# Patient Record
Sex: Female | Born: 1935 | Race: White | Hispanic: No | Marital: Single | Smoking: Never smoker
Health system: Southern US, Community
[De-identification: ages and names within clinical notes are randomized; demographics above are authoritative.]

## PROBLEM LIST (undated history)

## (undated) DIAGNOSIS — Z950 Presence of cardiac pacemaker: Secondary | ICD-10-CM

## (undated) DIAGNOSIS — I639 Cerebral infarction, unspecified: Secondary | ICD-10-CM

## (undated) DIAGNOSIS — I739 Peripheral vascular disease, unspecified: Secondary | ICD-10-CM

---

## 2009-02-09 ENCOUNTER — Inpatient Hospital Stay (HOSPITAL_COMMUNITY): Admission: EM | Admit: 2009-02-09 | Discharge: 2009-02-13 | Payer: Self-pay | Admitting: Neurosurgery

## 2009-02-12 ENCOUNTER — Ambulatory Visit: Payer: Self-pay | Admitting: Physical Medicine & Rehabilitation

## 2009-02-13 ENCOUNTER — Inpatient Hospital Stay (HOSPITAL_COMMUNITY)
Admission: RE | Admit: 2009-02-13 | Discharge: 2009-03-09 | Payer: Self-pay | Admitting: Physical Medicine & Rehabilitation

## 2009-02-14 ENCOUNTER — Ambulatory Visit: Payer: Self-pay | Admitting: Physical Medicine & Rehabilitation

## 2009-04-20 ENCOUNTER — Ambulatory Visit (HOSPITAL_COMMUNITY): Admission: RE | Admit: 2009-04-20 | Discharge: 2009-04-20 | Payer: Self-pay | Admitting: Neurosurgery

## 2009-04-23 ENCOUNTER — Encounter
Admission: RE | Admit: 2009-04-23 | Discharge: 2009-07-22 | Payer: Self-pay | Admitting: Physical Medicine & Rehabilitation

## 2009-04-24 ENCOUNTER — Ambulatory Visit: Payer: Self-pay | Admitting: Physical Medicine & Rehabilitation

## 2009-06-18 ENCOUNTER — Ambulatory Visit: Payer: Self-pay | Admitting: Physical Medicine & Rehabilitation

## 2009-10-10 ENCOUNTER — Encounter
Admission: RE | Admit: 2009-10-10 | Discharge: 2009-10-15 | Payer: Self-pay | Admitting: Physical Medicine & Rehabilitation

## 2009-10-15 ENCOUNTER — Ambulatory Visit: Payer: Self-pay | Admitting: Physical Medicine & Rehabilitation

## 2010-09-03 ENCOUNTER — Encounter
Admission: RE | Admit: 2010-09-03 | Discharge: 2010-09-03 | Payer: Self-pay | Source: Home / Self Care | Attending: Neurosurgery | Admitting: Neurosurgery

## 2010-09-16 ENCOUNTER — Encounter: Payer: Self-pay | Admitting: Neurosurgery

## 2010-12-01 LAB — BASIC METABOLIC PANEL
CO2: 29 mEq/L (ref 19–32)
Calcium: 9.4 mg/dL (ref 8.4–10.5)
Creatinine, Ser: 0.69 mg/dL (ref 0.4–1.2)
GFR calc non Af Amer: >60 mL/min (ref >60)
Glucose, Bld: 96 mg/dL (ref 70–99)

## 2010-12-01 LAB — CBC
MCHC: 33.9 g/dL (ref 30.0–36.0)
Platelets: 231 10*3/uL (ref 150–400)
RDW: 13.2 % (ref 11.5–15.5)

## 2010-12-01 LAB — DIGOXIN LEVEL: Digoxin Level: 0.4 ng/mL — ABNORMAL LOW (ref 0.8–2.0)

## 2010-12-02 LAB — BASIC METABOLIC PANEL
BUN: 5 mg/dL — ABNORMAL LOW (ref 6–23)
BUN: 7 mg/dL (ref 6–23)
BUN: 8 mg/dL (ref 6–23)
BUN: 9 mg/dL (ref 6–23)
CO2: 25 mEq/L (ref 19–32)
CO2: 26 mEq/L (ref 19–32)
CO2: 27 mEq/L (ref 19–32)
CO2: 28 mEq/L (ref 19–32)
Calcium: 8.3 mg/dL — ABNORMAL LOW (ref 8.4–10.5)
Calcium: 8.4 mg/dL (ref 8.4–10.5)
Calcium: 8.7 mg/dL (ref 8.4–10.5)
Calcium: 8.9 mg/dL (ref 8.4–10.5)
Chloride: 102 mEq/L (ref 96–112)
Chloride: 102 mEq/L (ref 96–112)
Chloride: 103 mEq/L (ref 96–112)
Chloride: 105 mEq/L (ref 96–112)
Creatinine, Ser: 0.49 mg/dL (ref 0.4–1.2)
Creatinine, Ser: 0.51 mg/dL (ref 0.4–1.2)
Creatinine, Ser: 0.53 mg/dL (ref 0.4–1.2)
Creatinine, Ser: 0.57 mg/dL (ref 0.4–1.2)
GFR calc Af Amer: >60 mL/min (ref >60)
GFR calc Af Amer: >60 mL/min (ref >60)
GFR calc Af Amer: >60 mL/min (ref >60)
GFR calc Af Amer: >60 mL/min (ref >60)
GFR calc non Af Amer: >60 mL/min (ref >60)
GFR calc non Af Amer: >60 mL/min (ref >60)
GFR calc non Af Amer: >60 mL/min (ref >60)
GFR calc non Af Amer: >60 mL/min (ref >60)
Glucose, Bld: 112 mg/dL — ABNORMAL HIGH (ref 70–99)
Glucose, Bld: 119 mg/dL — ABNORMAL HIGH (ref 70–99)
Glucose, Bld: 152 mg/dL — ABNORMAL HIGH (ref 70–99)
Glucose, Bld: 99 mg/dL (ref 70–99)
Potassium: 3.4 mEq/L — ABNORMAL LOW (ref 3.5–5.1)
Potassium: 3.5 mEq/L (ref 3.5–5.1)
Potassium: 3.8 mEq/L (ref 3.5–5.1)
Potassium: 4.1 mEq/L (ref 3.5–5.1)
Sodium: 135 mEq/L (ref 135–145)
Sodium: 136 mEq/L (ref 135–145)
Sodium: 138 mEq/L (ref 135–145)
Sodium: 139 mEq/L (ref 135–145)

## 2010-12-02 LAB — DIFFERENTIAL
Basophils Absolute: 0 10*3/uL (ref 0.0–0.1)
Basophils Absolute: 0 10*3/uL (ref 0.0–0.1)
Basophils Absolute: 0 10*3/uL (ref 0.0–0.1)
Basophils Relative: 0 % (ref 0–1)
Basophils Relative: 0 % (ref 0–1)
Basophils Relative: 1 % (ref 0–1)
Eosinophils Absolute: 0 10*3/uL (ref 0.0–0.7)
Eosinophils Absolute: 0 10*3/uL (ref 0.0–0.7)
Eosinophils Absolute: 0.1 10*3/uL (ref 0.0–0.7)
Eosinophils Relative: 1 % (ref 0–5)
Eosinophils Relative: 1 % (ref 0–5)
Eosinophils Relative: 1 % (ref 0–5)
Lymphocytes Relative: 11 % — ABNORMAL LOW (ref 12–46)
Lymphocytes Relative: 15 % (ref 12–46)
Lymphocytes Relative: 19 % (ref 12–46)
Lymphs Abs: 0.7 10*3/uL (ref 0.7–4.0)
Lymphs Abs: 0.9 10*3/uL (ref 0.7–4.0)
Lymphs Abs: 1 10*3/uL (ref 0.7–4.0)
Lymphs Abs: 1.3 10*3/uL (ref 0.7–4.0)
Monocytes Absolute: 0.5 10*3/uL (ref 0.1–1.0)
Monocytes Absolute: 0.6 10*3/uL (ref 0.1–1.0)
Monocytes Absolute: 0.6 10*3/uL (ref 0.1–1.0)
Monocytes Relative: 8 % (ref 3–12)
Monocytes Relative: 9 % (ref 3–12)
Monocytes Relative: 9 % (ref 3–12)
Monocytes Relative: 9 % (ref 3–12)
Neutro Abs: 3.9 10*3/uL (ref 1.7–7.7)
Neutro Abs: 4.7 10*3/uL (ref 1.7–7.7)
Neutro Abs: 5.2 10*3/uL (ref 1.7–7.7)
Neutro Abs: 5.3 10*3/uL (ref 1.7–7.7)
Neutrophils Relative %: 71 % (ref 43–77)
Neutrophils Relative %: 72 % (ref 43–77)
Neutrophils Relative %: 77 % (ref 43–77)
Neutrophils Relative %: 79 % — ABNORMAL HIGH (ref 43–77)

## 2010-12-02 LAB — PREPARE FRESH FROZEN PLASMA

## 2010-12-02 LAB — COMPREHENSIVE METABOLIC PANEL
BUN: 8 mg/dL (ref 6–23)
Calcium: 9 mg/dL (ref 8.4–10.5)
Glucose, Bld: 106 mg/dL — ABNORMAL HIGH (ref 70–99)
Sodium: 137 mEq/L (ref 135–145)
Total Protein: 6.4 g/dL (ref 6.0–8.3)

## 2010-12-02 LAB — CBC
HCT: 27.4 % — ABNORMAL LOW (ref 36.0–46.0)
HCT: 27.7 % — ABNORMAL LOW (ref 36.0–46.0)
HCT: 30.8 % — ABNORMAL LOW (ref 36.0–46.0)
HCT: 32 % — ABNORMAL LOW (ref 36.0–46.0)
Hemoglobin: 10.5 g/dL — ABNORMAL LOW (ref 12.0–15.0)
Hemoglobin: 10.8 g/dL — ABNORMAL LOW (ref 12.0–15.0)
Hemoglobin: 9.3 g/dL — ABNORMAL LOW (ref 12.0–15.0)
Hemoglobin: 9.4 g/dL — ABNORMAL LOW (ref 12.0–15.0)
MCHC: 33.9 g/dL (ref 30.0–36.0)
MCHC: 34.1 g/dL (ref 30.0–36.0)
MCHC: 34.1 g/dL (ref 30.0–36.0)
MCHC: 34.1 g/dL (ref 30.0–36.0)
MCV: 95 fL (ref 78.0–100.0)
MCV: 95.4 fL (ref 78.0–100.0)
MCV: 95.4 fL (ref 78.0–100.0)
Platelets: 193 10*3/uL (ref 150–400)
Platelets: 203 10*3/uL (ref 150–400)
Platelets: 246 10*3/uL (ref 150–400)
RBC: 2.88 MIL/uL — ABNORMAL LOW (ref 3.87–5.11)
RBC: 2.91 MIL/uL — ABNORMAL LOW (ref 3.87–5.11)
RBC: 3.35 MIL/uL — ABNORMAL LOW (ref 3.87–5.11)
RDW: 13.2 % (ref 11.5–15.5)
RDW: 13.3 % (ref 11.5–15.5)
RDW: 13.4 % (ref 11.5–15.5)
RDW: 13.5 % (ref 11.5–15.5)
WBC: 6.6 10*3/uL (ref 4.0–10.5)
WBC: 6.7 10*3/uL (ref 4.0–10.5)
WBC: 6.9 10*3/uL (ref 4.0–10.5)

## 2010-12-02 LAB — PROTIME-INR
INR: 1.3 (ref 0.00–1.49)
INR: 1.4 (ref 0.00–1.49)
INR: 1.7 — ABNORMAL HIGH (ref 0.00–1.49)
Prothrombin Time: 16.2 seconds — ABNORMAL HIGH (ref 11.6–15.2)
Prothrombin Time: 18.1 seconds — ABNORMAL HIGH (ref 11.6–15.2)
Prothrombin Time: 20.7 seconds — ABNORMAL HIGH (ref 11.6–15.2)

## 2010-12-02 LAB — URINE CULTURE

## 2010-12-02 LAB — APTT
aPTT: 33 seconds (ref 24–37)
aPTT: 34 seconds (ref 24–37)
aPTT: 36 seconds (ref 24–37)

## 2010-12-02 LAB — CROSSMATCH
ABO/RH(D): O POS
Antibody Screen: NEGATIVE

## 2010-12-02 LAB — URINALYSIS, MICROSCOPIC ONLY
Glucose, UA: NEGATIVE mg/dL
Hgb urine dipstick: NEGATIVE
Ketones, ur: NEGATIVE mg/dL
pH: 6.5 (ref 5.0–8.0)

## 2010-12-02 LAB — ABO/RH: ABO/RH(D): O POS

## 2011-01-07 NOTE — H&P (Signed)
[name], [name] NO.:  [id number]   MEDICAL RECORD NO.:  [id number]          PATIENT TYPE:  IPS   LOCATION:  4025                         FACILITY:  [facility]   PHYSICIAN:  [name], M.D.DATE OF BIRTH:  23-Nov-1931   DATE OF ADMISSION:  02/13/2009  DATE OF DISCHARGE:                              HISTORY & PHYSICAL   CHIEF COMPLAINT:  Decreased balance and confusion.   HISTORY OF PRESENT ILLNESS:  This is a 77 year old white female with  history of bradycardia and permanent pacemaker placement.  She also  suffered recent distal radius ulna fracture after a fall.  On February 09, 2009, the patient presented to [name] with headache and  weakness in the right side and difficulty speaking.  Head CT showed a  large left subdural hemorrhage with mass effect and shift.  The patient  had been on chronic Coumadin for her atrial fibrillation and INR was  reversed.  The patient underwent a left frontoparietal craniotomy for  evacuation of the subdural on February 10, 2009, by Dr. Newell Griffin.  The  patient has been placed on Keppra for seizure prophylaxis.  The patient  says difficulties still with balance and overall weakness.  Followup  head CT on February 12, 2009, showed extra-axial fluid collection in the  left cerebral hemisphere with 6 mm left-to-right midline shift and a  thin right subdural collection.  The patient is clinically improving;  however.  She is hoping to get home and be with her sister.  Rehab was  consulted yesterday and felt that she could benefit from an admission  and she was brought today.   REVIEW OF SYSTEMS:  Notable for some weakness, mild headache, but really  minimal pain complaints.  She is moving her bowels and bladder.  Full 14-  point review is in the written H and P.   PAST MEDICAL HISTORY:  Positive for syncope with permanent pacemaker and  the radial/ulnar fracture in May 2010.  Past medical history is also  notable for CAF,  hypertension, right total knee replacement, and OA of  the left knee.  The patient with significant flexion contracture of the  right knee.  Right foot calluses treated chronically with debridement  and also glaucoma.   FAMILY HISTORY:  Positive for heart disease.   SOCIAL HISTORY:  The patient lives with her sister.  One-level house,  three steps to enter prior to her fall.  She went to SNF initially, but  will go back with her sister at this time.  She does not smoke or drink.  She is a retired Company secretary.  She has no children.  She has a sister  who can provide supervision at discharge.   ALLERGIES:  CODEINE.   HOME MEDICATIONS:  Coumadin, MiraLax, Colace b.i.d., vitamin C, Folgard,  zinc, hydralazine, lisinopril, metoprolol XR, and Lanoxin.   LABORATORY DATA:  Hemoglobin 9.4, white count 6.6, platelets 193.  Sodium 138, potassium 3.5, BUN 80, creatinine 0.81.   PHYSICAL EXAMINATION:  VITAL SIGNS:  Blood pressure is 144/73, pulse 91,  temperature 98.6, and  respiratory rate 22.  GENERAL:  The patient is pleasant, alert, and oriented x3.  HEENT:  Pupils equally round and reactive to light.  Ear, nose, throat,  and head exam is notable for intact left frontoparietal incision, which  is well approximated and dry.  Area is minimally tender.  Oral mucosa is  pink and moist.  Dentition is fair.  NECK:  Supple without JVD or lymphadenopathy.  CHEST:  Clear to auscultation bilaterally without wheezes, rales, or  rhonchi.  HEART:  Notable for irregularly irregular rhythm with mild systolic  murmur.  EXTREMITIES:  No clubbing, cyanosis, or edema.  Right knee lacks  approximately 35 degrees of extension on exam.  She has some limitation  also in the right shoulder.  Left knee was minimally inhibited perhaps 5  degrees of flexion contracture noted.  ABDOMEN:  Soft and nontender.  Bowel sounds are positive.  SKIN:  Notable for chronic changes in the lower extremities.  No focal   breakdown was appreciated.  The right knee is notable for postoperative  scar, which is intact.  NEUROLOGIC:  Cranial nerves II-XII were grossly intact.  She may have  had a mild right central VII.  She had intact visual fields.  Speech was  clear.  She is able to process simple phrases and sentences and do some  problem-solving for me today.  She had minimal strength difference right  to left.  She has 4/5 right upper extremity where 4+/5 on the left upper  extremity was seen.  Right lower extremity was 4/5 where as left lower  extremity is 4+/5 again.  She does lack some finer motor movements in  the right upper and right lower extremities today.  Judgment was fair.  Memory was good and mood was pleasant.   POST ADMISSION PHYSICIAN EVALUATION:  1. Functional deficits secondary to left subdural hemorrhage with      right hemiparesis, decreased balance in the setting of prior      arthritis of the right leg and right radial ulnar fracture.  2. The patient is admitted to receive collaborative interdisciplinary      care between the physiatrist, rehab nursing staff, and therapy      team.  3. The patient's level of medical complexity and substantial therapy      needs in context of that medical necessity cannot be provided at a      lesser intensity of care.  4. The patient has experienced substantial functional loss from her      baseline.  Upon functional assessment at the time of preadmission      screening, the patient was mod assist bed mobility, transfers,      ambulation.  She remains at that same level approximately 24 hours      later.  Judging by the patient's diagnosis, physical exam, and      functional history, she has a potential for functional progress,      which will result in measurable gains while in inpatient rehab.      These gains will be of substantial and practical use upon discharge      to home in facilitating mobility and self care.  5. Physiatrist will provide  24-hour management of medical needs as      well as oversight of the therapy plan/treatment and provide      guidance as appropriate regarding interaction of the two.  Medical      problem list and plan are listed below.  6. 24-hour rehab nursing will assist in the management of the      patient's skin care needs as well as nutrition, safety, bowel and      bladder function, medication, and pain management.  7. PT will assess and treat for lower extremity strength, balance,      neuromuscular reeducation, cognitive perceptual training,      functional mobility and gait.  The patient will be limited somewhat      by her chronic right knee flexion contracture.  Goals are      supervision to occasional min assist.  8. OT will assess and treat for upper extremity use ADLs, adaptive      techniques and equipment, safety awareness and family/patient      education with goals, supervision to occasional min assist.  9. Speech language pathology will assess and follow briefly for speech      and cognition, although she seems fairly lucent and should reach      modified independent goals quickly.  10.Case management and social worker will assess and treat for      psychosocial issues and discharge planning.  11.Team conferences will be held weekly to assess progress towards      goals and to determine barriers at discharge.  12.The patient has demonstrated sufficient medical stability and      exercise capacity to tolerate at least 3 hours of therapy per day      at least 5 days per week.  13.Estimated length of stay is approximately 2 to 2-1/2 weeks.      Prognosis is good.   MEDICAL PROBLEM LIST AND PLAN:  1. Chronic atrial fibrillation:  The patient's heart rate is under      fair control at this point.  Continue digoxin and beta-blocker.  We      will follow clinically and observe for tolerance of therapy, fluid      status, etc.  2. Hypertension:  Continue above medications as well as  hydralazine      and Prinivil.  Blood pressure is normotensive today and we will      follow clinically with activity.  3. Seizure prophylaxis:  Keppra b.i.d.  The patient seems to be      tolerating this fairly well.  4. Distal right radial ulnar fracture:  We will have Orthopedic      Surgery follow up with the patient here on rehab.  5. Acute blood loss anemia:  We will follow up admission labs tomorrow      and add iron supplementation.  6. Bowels:  Colace and MiraLax for constipation.      [name], M.D.  Electronically Signed     ZTS/MEDQ  D:  02/13/2009  T:  02/14/2009  Job:  [id number]

## 2011-01-07 NOTE — Op Note (Signed)
NAMEKATALYN, MATIN NO.:  [id number]   MEDICAL RECORD NO.:  [id number]          PATIENT TYPE:  INP   LOCATION:  3113                         FACILITY:  [facility]   PHYSICIAN:  Hewitt Shorts, M.D.DATE OF BIRTH:  06/28/1932   DATE OF PROCEDURE:  02/10/2009  DATE OF DISCHARGE:                               OPERATIVE REPORT   PREOPERATIVE DIAGNOSIS:  Left hemispheric subacute subdural hematoma.   POSTOPERATIVE DIAGNOSIS:  Left hemispheric subacute subdural hematoma.   PROCEDURE:  Left frontoparietal craniotomy and evacuation of subdural  hematoma.   SURGEON:  Hewitt Shorts, MD   ANESTHESIA:  General endotracheal.   INDICATIONS:  The patient is a 77 year old woman who presented with  right hemiparesis, headache, and aphasia.  She is found by CT scan to  have a large left hemispheric subacute subdural hematoma.  The decision  was made to proceed with craniotomy and evacuation of hematoma.   PROCEDURE IN DETAIL:  The patient was brought to the operating room and  placed under general endotracheal anesthesia.  The patient's scalp was  shaved, and then the head was placed in a doughnut headrest with a roll  beneath behind the left shoulder with the torso turned gently to the  right.  The scalp was prepped with Betadine soap and solution, and  draped in a sterile fashion.   A left parasagittal incision was made.  The line of the incision was  infiltrated with local anesthetic with epinephrine.  Raney clips were  applied to the scalp edges to maintain hemostasis.  Self-retaining  retractors were placed and single a burr hole was made in the inferior  exposure.  The dura was dissected from the overlying skull, then the  craniotome attachment was used to turn a bone flap.  The dura was tacked  up around the margins of the craniotomy with 4-0 Nurolon sutures and the  dura was opened in the U-shaped fashion, hinged towards the midline.  There was a large  subdural hematoma of subacute density.  It was gently  irrigated off the brain with warm saline until clear.  The dural edges  were coagulated.  Surgicel was placed around the margins of the  craniotomy and then Lorenz cranial plates were applied to the bone flap.  We used 1 square plate and 2 medium size straight plates, 4-mm self-  drilling screws were used.  A dural tack-up suture was placed in the  midportion of the bone flap and the dura was tacked up to the midportion  of the bone flap.  The scalp was closed with  interrupted inverted 2-0  undyed Vicryl sutures.  Skin was closed with surgical staples.  The  wound was dressed with Adaptic and sterile gauze, wrapped with 2 Kerlix  and  then 2 Kling.  The procedure was tolerated well.  The estimated blood  loss was 50 mL.  Sponge count was correct.  Following surgery, the  patient is to be reversed from the anesthetic, to be extubated, and  transferred to the intensive care unit for further care.  Hewitt Shorts, M.D.  Electronically Signed     RWN/MEDQ  D:  02/10/2009  T:  02/11/2009  Job:  [id number]

## 2011-01-07 NOTE — Discharge Summary (Signed)
[name], [name] NO.:  [id number]   MEDICAL RECORD NO.:  [id number]          PATIENT TYPE:  IPS   LOCATION:  4025                         FACILITY:  [facility]   PHYSICIAN:  Ranelle Oyster, M.D.DATE OF BIRTH:  1932-07-19   DATE OF ADMISSION:  02/13/2009  DATE OF DISCHARGE:  03/09/2009                               DISCHARGE SUMMARY   DISCHARGE DIAGNOSIS:  1. A left subdural hemorrhage requiring craniotomy.  2. Chronic atrial fibrillation.  3. Hypertension.  4. Right distal radius fracture.   HISTORY OF PRESENT ILLNESS:  [name] is a 77 year old female with  history of bradycardia with recent permanent pacemaker and distal radius  ulna fracture admitted via [facility] on June 18 with headache,  weakness right side, and speech difficulty for a few days.  CCT done  revealed large left subdural hemorrhage with mass effect and shift.  The  patient on Coumadin for atrial fibrillation, and required reversal of  INR prior to undergoing left frontal parietal craniotomy for evacuation  on June 19 by Dr. Newell Coral.  The patient was placed on Keppra for  seizure prophylaxis postop.  Therapy evaluations done revealed patient  with decreased balance with posterior lean in standing requiring verbal  cues for safe rolling walker use.  The patient was evaluated by rehab,  and felt that she would benefit from CIR program.   PAST MEDICAL HISTORY:  Significant for:   1. Syncope with permanent pacemaker and radius ulnar fracture in May      of this year.  2. Chronic atrial fibrillation.  3. Hypertension.  4. Right total knee replacement.  5. History of OA left knee.  6. Right foot callus treated with shaving in the past.  7. Glaucoma.   Review of symptoms positive for some weakness, mild headaches.   FAMILY HISTORY:  Positive for heart problems.   SOCIAL HISTORY:  The patient lives with sister in one level home with 3  steps at entry prior to fall in May of  this year.  She was discharged to  [facility] May 15, and was receiving ongoing therapies.  The patient is a  retired Company secretary, single, no children.  She does not use any  tobacco or alcohol.   PHYSICAL EXAMINATION:  VITALS:  Blood pressure 143/73, pulse 91,  temperature 98.6, respiratory rate 22.  GENERAL:  The patient is pleasant, alert female, oriented x3.  HEENT:  Pupils equal, round, reactive to light.  Ears, nose, throat  exam:  Notable for intact left frontal parietal incision which is well-  approximated and dry.  Oral mucosa is pink and moist.  Dentition is  fair.  Hearing intact.  NECK:  Supple without JVD or lymphadenopathy.  CHEST:  Clear to auscultation bilaterally without wheezes, rales or  rhonchi.  HEART:  Notable for irregularly irregular rhythm with mild systolic  murmur.  EXTREMITIES:  Showed no evidence of cyanosis or clubbing.  Right knee  lacks approximately 35 degree extension on exam.  She had some  limitation on right shoulder.  Left knee was minimally inhibited perhaps  by decrease of flexion contracture noted.  Cast on place on right hand  with 1+ edema noted.  ABDOMEN:  Soft, nontender with positive bowel sounds.  SKIN:  Notable for chronic changes in lower extremities.  No focal  breakdown.  Old knee replacement scar on right knee.  NEUROLOGIC:  Cranial nerves II-XII grossly intact.  She may __________  right central VII.  She has intact visual fields.  Speech clear.  She is  able to process simple phrases and sentences, and do some problem  solving without difficulty.  She has minimal strength __________ right  versus left.  She has 4/5 right upper extremity with 4+/5 left upper  extremity seen.  Right lower extremities 4/5 and left lower is 4+/5.  Again she does lack in fine motor movement right upper and right lower  extremity.  Judgment is fair.  Memory is good.  Mood is pleasant.   HOSPITAL COURSE:  [name] was admitted to rehab on February 13, 2009  for inpatient therapies to consist of PT, OT, and speech therapy at  least 3 hours 5 days a week.  Past admission physiatrist, rehab RN, and  therapy team have worked together to provide customized collaborative  interdisciplinary care.  Rehab RN has been assisting by monitoring the  patient's p.o. intake as well as wound care and skin care monitoring.  The patient has had issues with constipation, and bowel program was  adjusted to assist with this.  The patient was noted to have cast on  right upper extremity that was noted to be tight and limiting patient's  finger range of motion.  Cast was changed out to provide benefit.  Labs  done past admission revealed hemoglobin 10.5, hematocrit 30.8, white  count 5.5, platelets 262.  Check of electrolytes revealed sodium 137,  potassium 3.6, chloride 101, CO2 28, BUN 8, creatinine 0.04, glucose  106.  Follow-up x-rays of right wrist were done.  This was limited by  diffuse osteopenia and casting artifact.  Mild displaced fracture of  ulnar styloid was noted.  Dr. August Saucer, orthopedist, was on call.  Was  consulted regarding films and about input on changing patient's cast.  He felt that patient would still require casting for now, and she is to  follow up with Dr. Bevely Palmer with comparison films in the next few weeks.  Dr. Newell Coral in neurosurgery has been following with input on patient.  He recommended doing follow-up CT of head on June 7.  This revealed  slight decrease in size of subacute left frontal parietal subdural  hematoma and no new hemorrhages identified.  The patient's blood  pressures were checked on b.i.d. basis during this stay.  She was noted  to have some issues with hypotension.  Therefore Prinivil and Apresoline  were DC'd.  The patient's blood pressures currently ranging from 110-130  systolics, 60s to 80s diastolic.  Heart rate has been in 60s to 80s  range overall.  Last weight is at 73 kg.  Routine check of labs have   been done during this stay to monitor her H and H as well as  electrolytes.  Most recent check of CBC of July 12 shows improvement in  her acute blood loss anemia with hemoglobin af 11.9, hematocrit at 35.0.  Check of electrolytes revealed sodium 138, potassium 3.5, chloride 101,  CO2 29, BUN 12, creatinine 0.69, glucose 96.  Digoxin level on July 12  was noted to be on low side at  0.4, and digoxin was increased to 0.25 mg  per day on July 15.  Recommend checking digoxin level on July 19 to make  sure the patient in therapeutic level without any signs of toxicity.  The patient was noted to have some issues with endurance with easy  distractibility, and a Ritalin trial was initiated to see if this could  help with attention.  This has shown some improvement initially.  However, the patient has had increase in anxiety levels, and this has  been tapered off by time of discharge.  The patient is noted to have  some contractures of fingers on right hand with decreased range of  motion, and figure eight splints were ordered for right third, fourth,  and fifth PIPs.   During patient's stay in rehab, weekly team conferences were held to  monitor patient's progress and goals as well as discuss barriers to  discharge.  At time of admission patient was noted to be limited in use  of right upper extremity due to her fracture and muscle weakness.  She  was noted to experience discoordination as well as limitations due to  apraxia,  inability to follow complex verbal direction, __________  inattention and apraxia with decreased use of right upper extremity.  The patient required mod cues with mobility due to decrease in motor  planning due to apraxia and a poor awareness of self with static  standing.  The patient continues to be limited by anxiety, apraxia as  well as easy distractibility.  OT has been working with the patient on  ADLs at sink as well as toilet transfers with focus on safety  awareness,  standing balance, and sequencing.  She requires min to mod assist for  transfers.  She continues to require cuing, and is to improve awareness  of right upper extremity.  Speech has been working with the patient to  improve her comprehension of complex auditory information as well as  improve complex reasoning and problem solving.  Currently patient is at  mod to max assist for functional sequencing with ADL tasks.  She  continues with poor intellectual awareness of current cognitive physical  impairments with extremely poor decision making and problem solving.  The patient was at min assist for bed mobility at time of admission.  She was able to sit at edge of bed with posterior lean.  She required  max assist for stand pivot transfers including posteriorly, being very  anxious about her lean.  She was able to ambulate 25 feet with min  assist with small step especially on right lower extremity.  She was  able to propel wheelchair max assist bilateral lower extremity with  upper axial and weakness right lower extremity.  The patient continues  to require cuing to maintain nonweightbearing status on right upper  extremity.  She still requires mod to max verbal cues and redirection  for her mobility having poor awareness into her deficits and abilities.  She is able to propel her wheelchair at min assist with decreased use of  left upper extremity.  She is able to perform stand pivot transfers at  min __________ assist with increased time and moderate cues to maintain  her nonweightbearing status.  She is able to ambulate up to 120 feet  with platform rolling walker with close supervision.  She is at close  supervision for dynamic standing balance.  Due to her cognitive as well  as physical deficits her sister will be unable to provide  the assistance  as needed, and SNF was elected for further progressive therapy.  Bed is  available at [facility] for July 16, and patient is to  be discharged  to this facility with progressive therapies to continue past discharge.   DISCHARGE MEDICATIONS:  1. Keppra 500 mg b.i.d.  2. Lopressor 100 mg b.i.d.  3. Pepcid 20 mg b.i.d.  4. Ferrous sulfate 325 mg in p.m.  5. Xalatan 0.005%  q.h.s.  6. Xanax 0.5 mg p.o. q.a.m.  7. Senokot-S 2 p.o. q.h.s.  8. Lanoxin 0.25 mg per day.  9. Ultram 50 mg p.o. q.6 hours p.r.n. pain.  10.Tylenol 650 mg p.o. q.4-6 hours p.r.n. mild pain.      Greg Cutter, P.A.      Ranelle Oyster, M.D.  Electronically Signed    PP/MEDQ  D:  03/09/2009  T:  03/09/2009  Job:  [id number]   cc:   Feliciana Rossetti, MD  Dulce Sellar, M.D.  Ardine Eng, M.D.

## 2011-01-07 NOTE — H&P (Signed)
[name], GOEHRING NO.:  [id number]   MEDICAL RECORD NO.:  [id number]          PATIENT TYPE:  INP   LOCATION:  3113                         FACILITY:  [facility]   PHYSICIAN:  Hewitt Shorts, M.D.DATE OF BIRTH:  09/30/1931   DATE OF ADMISSION:  02/09/2009  DATE OF DISCHARGE:                              HISTORY & PHYSICAL   HISTORY OF PRESENT ILLNESS:  The patient is a 77 year old right-handed  white female who was transferred from [facility] Emergency Room  at the request of Dr. Okey Dupre with diagnosis of a large left subacute  subdural hematoma.   She had a fall in early May and was hospitalized at [facility]  eventually having a pacemaker placed.  She has a history of atrial  fibrillation for which she was on Coumadin.  It was with further  difficulty that she was placed in the [facility],  however over the past week or 2, she has been having increasing  headaches and weakness on the right side; and over the past few days,  she has had increasing difficulty expressing herself.  The patient was  sent to the emergency department for evaluation this evening.  CT  revealed the large left subacute subdural hematoma with mass effect and  shift, and Neurosurgical consultation was requested.   The patient had been continued on Coumadin, and Dr. Okey Dupre, the emergency  room physician, initiated reversal administering 2 doses of vitamin K 10  mg and 2 units of fresh frozen plasma.   PAST MEDICAL HISTORY:  Notable for history of atrial fibrillation and  hypertension.   Her cardiologist is Dr. Tyson Babinski in [place].   PREVIOUS SURGERY:  Right total knee replacement as well as some foot  surgeries.   She reports an allergy to CODEINE causing nausea and vomiting.   MEDICATIONS PRIOR TO HOSPITALIZATION:  1. Coumadin 5 mg daily.  2. Xalatan eye drops 1 drop both eyes daily.  3. Lisinopril 10 mg b.i.d.  4. Metoprolol 100 mg b.i.d.  5.  Hydralazine 50 mg b.i.d.  6. Digoxin 0.125 mg daily.   FAMILY HISTORY:  Noncontributory.  Parents are passed on.   SOCIAL HISTORY:  The patient currently is residing at [facility].  She does not smoke or drink.   REVIEW OF SYSTEMS:  Notable for those described in the history of  present illness and past medical history, is otherwise unremarkable.   PHYSICAL EXAMINATION:  GENERAL:  The patient is an elderly white female  in not acute distress.  VITAL SIGNS:  Temperature 97.8, pulse 86, and blood pressure 137/57.  LUNGS:  Clear to auscultation.  She has symmetric respiratory excursion.  HEART:  Regular rate and rhythm with a 2/6 systolic murmur.  ABDOMEN:  Soft, nontender, and nondistended.  Bowel sounds are  diminished.  EXTREMITIES:  No clubbing, cyanosis, or edema, but there are venous  stasis changes at the ankles bilaterally.  NEUROLOGIC:  The patient is awake, alert.  She is oriented to her name,  [facility], and June 2010, but her speech is  slowed, and she  does have some word finding difficulties.  Cranial nerves show pupils  are equal, round, and reactive to light and about 4 mm bilaterally.  Extraocular movements are intact.  She has a mild right facial paresis  with flattening of the right nasolabial fold.  Tongue is midline.  Motor  examination shows right hemiparesis with mild drift to the right upper  extremity and mild weakness in the right lower extremity.   Additional history is that when she fell, she fractured her right wrist  5 weeks ago, and it was casted by Dr. Ulice Brilliant in [place].  She is  still on a cast on the right forearm, wrist, and hand.   IMPRESSION:  Large left subacute subdural hematoma with right  hemiparesis and aphasia contributed by treatment with Coumadin as well  as a fall in early May.   PLAN:  The patient was admitted to the Neurosurgical Intensive Care Unit  and prepared for craniotomy for evacuation of  subdural hematoma.  I have  met with the patient and her sister, [name], and a family friend,  [name].  We discussed the nature of the patient's condition, the  prognosis without surgical evacuation and recommendation for craniotomy  for surgical evacuation of subdural hematoma.  We discussed the risks of  the surgery including risk of infection, bleeding, possibility of  transfusion, the risk of neurologic dysfunction including paralysis,  coma, and death, and anesthetic risk of myocardial infarction, stroke,  pneumonia, and death.  Understanding all these, they agreed for [place] to go  ahead with surgery, and the patient is being prepared for such.      Hewitt Shorts, M.D.  Electronically Signed     RWN/MEDQ  D:  02/09/2009  T:  02/10/2009  Job:  [id number]

## 2011-01-07 NOTE — Assessment & Plan Note (Signed)
HISTORY:  Besse is back regarding her left subdural hemorrhage.  She was  discharged to nursing home, who is now home with her sister and doing  quite well.  She is walking with a walker.  She is receiving home health  therapy now.  She has not received OT yet.  She has some tightness still  on her right hand.  She is actually helping with drying dishes and  making the bed.  She has some pain and stiffness in her right knee, but  really denies pain overall.  She feels that her mentation is improving.  Sister is there for support.   REVIEW OF SYSTEMS:  Notable for the above.  Fourteen-point review of  systems in the health history and section of the chart.   SOCIAL HISTORY:  As noted above.  The patient is living with her sister.  She would like to return driving sometime soon.   PHYSICAL EXAMINATION:  VITAL SIGNS:  Blood pressure 145/65, pulse is 87,  respiratory rate 18, and she is sating 96% on room air.  GENERAL:  The patient is pleasant, alert, and oriented x3.  NEUROLOGIC:  Affect is generally appropriate.  She needs extra time to  rise from a sitting to standing position as her right knee will not  fully bend.  Her strength is improved quite a bit throughout the right  side with the right leg pain 3-4/5 inconsistently and somewhat  occasionally due to have pain, inhibition at the knee.  Knee will not  fully extend past -10 to 15 degrees in the flexion, really cannot be  accomplished past 90.  She has some generalized swelling and sclerosis  of the knee with a total knee replacement scar noted.  Right upper  extremity strength is 3/5 in general.  She has contractures of the right  hand is slightly flexed position and these cannot be extended digits 2  through 5 to full extension despite whatever pressure I applied.  She  also has some difficulty with grip in the right hand as well.  Cognitively, she has good insight awareness.  She is able to follow  commands.  She is bright and  appropriate socially speaking.  She is able  to walk with a slightly shuffling gait using rolling walker, but really  did very well with change of directions, etc.   ASSESSMENT:  1. Left subdural hemorrhage with craniectomy and right hemiparesis.  2. History of right distal radius fracture with persistent contracture      in digits 2 through 5.   PLAN:  1. Continue with home health PT and OT.  She may benefit from      progression to Outpatient Services.  2. I provided the patient with a script to get OT to work on      extension, wrist hand orthosis to improve movement and extension in      her digits 2 through 5 at the PIP and DIP joints.  3. Continue with same medications for now per her cardiologist and      family physician.  4. We will discuss driving in a future date.  She is not appropriate      at this point to drive.  5. I am thrilled with her progress.  I will see her back in about 2      months.      Ranelle Oyster, M.D.  Electronically Signed     ZTS/MedQ  D:  04/24/2009 13:29:30  T:  04/25/2009 04:54:09  Job #:  [id number]

## 2011-01-10 NOTE — Discharge Summary (Signed)
NAMESHENIKA, QUINT NO.:  [id number]   MEDICAL RECORD NO.:  [id number]          PATIENT TYPE:  INP   LOCATION:  3023                         FACILITY:  [facility]   PHYSICIAN:  Hewitt Shorts, M.D.DATE OF BIRTH:  Aug 09, 1932   DATE OF ADMISSION:  02/09/2009  DATE OF DISCHARGE:  02/13/2009                               DISCHARGE SUMMARY   ADMISSION HISTORY AND PHYSICAL EXAMINATION:  The patient is a 77-year-  old woman who transferred from the [facility] Emergency Room with  a diagnosis of large left subacute subdural hematoma.  The patient fall  in early May 2010 and was hospitalized at [facility] and then she  had a pacemaker placed.  She had a history of atrial fibrillation for  which she is on Coumadin.  She was subsequently placed in the [facility].  However, over the past week or 2 preceding  the hospitalization, she had increasing headaches and weakness on the  right side and increasing difficulties expressing herself.  The patient  was seen at [facility] Emergency Room.  CT showed a large left  subacute subdural hematoma, and the patient was transferred to [facility] for further care.  The emergency room physician at  [facility] initiated reversal with 2 doses of vitamin K and 2  units of fresh-frozen plasma.   PAST HISTORY:  Notable for atrial fibrillation and hypertension.   PHYSICAL EXAMINATION:  GENERAL:  Unremarkable.  NEUROLOGIC:  The patient was awake, alert, oriented to her name, [facility], June 2010, but her speech was slow and she had difficulty  finding some words.  Cranial nerves were notable for a mild right facial  paresis with flattening of the right nasolabial fold.  Neuro examination  showed a right hemiparesis with mild drift to the right upper extremity  and mild weakness to the right lower extremity.  She also has had a  history of a fall 5 weeks earlier in which  she fractured her right wrist  and had a cast placed by Dr. Dr. Ulice Brilliant, and the cast was still in  place in the right forearm, wrist, and hands.   HOSPITAL COURSE:  The patient was admitted and prepared for surgery once  reversal of the anticoagulation with Coumadin was completed.  The  patient was taken to surgery on February 10, 2009, showed a left  frontoparietal craniotomy and evacuation of subdural hematoma.  Postoperatively, she was awake, alert and fully oriented.  Laboratories  were good.  The patient continues to improve.  Physical Therapy and  Occupational Therapy consultations were obtained.  They felt the patient  was a good candidate for comprehensive inpatient rehabilitation, and the  patient was seen in rehabilitation medicine consultation by Dr. Faith Rogue, he agreed and the patient was subsequently transferred to the  rehabilitation unit.  Follow up CT showed mild residual subdural fluid,  overall diminished mass effect as compared prior to surgery.  The  patient was transferred to the rehabilitation unit on the day of  discharge for [place]  to follow while the patient is on rehabilitation.   FINAL DIAGNOSES:  1. Large subacute left hemispheric subdural hematoma.  2. Anticoagulation with Coumadin.  3. Right hemiparesis.  4. Aphasia.      Hewitt Shorts, M.D.  Electronically Signed     Hewitt Shorts, M.D.  Electronically Signed    RWN/MEDQ  D:  03/28/2009  T:  03/29/2009  Job:  [id number]

## 2016-06-14 ENCOUNTER — Encounter (HOSPITAL_COMMUNITY): Payer: Self-pay | Admitting: *Deleted

## 2016-06-14 ENCOUNTER — Emergency Department (HOSPITAL_COMMUNITY)
Admission: EM | Admit: 2016-06-14 | Discharge: 2016-06-14 | Disposition: A | Discharge status: Home / Self Care | Payer: Medicare Other | Attending: Emergency Medicine | Admitting: Emergency Medicine

## 2016-06-14 ENCOUNTER — Emergency Department (HOSPITAL_COMMUNITY): Payer: Medicare Other

## 2016-06-14 DIAGNOSIS — R519 Headache, unspecified: Secondary | ICD-10-CM

## 2016-06-14 DIAGNOSIS — Z950 Presence of cardiac pacemaker: Secondary | ICD-10-CM | POA: Diagnosis not present

## 2016-06-14 DIAGNOSIS — J069 Acute upper respiratory infection, unspecified: Secondary | ICD-10-CM

## 2016-06-14 DIAGNOSIS — Z8673 Personal history of transient ischemic attack (TIA), and cerebral infarction without residual deficits: Secondary | ICD-10-CM | POA: Insufficient documentation

## 2016-06-14 DIAGNOSIS — R51 Headache: Secondary | ICD-10-CM | POA: Insufficient documentation

## 2016-06-14 HISTORY — DX: Cerebral infarction, unspecified: I63.9

## 2016-06-14 HISTORY — DX: Peripheral vascular disease, unspecified: I73.9

## 2016-06-14 HISTORY — DX: Presence of cardiac pacemaker: Z95.0

## 2016-06-14 MED ORDER — LORATADINE 10 MG PO TABS: 10.0000 mg | ORAL_TABLET | Freq: Every day | ORAL | 0 refills | Status: AC

## 2016-06-14 MED ORDER — OXYMETAZOLINE HCL 0.05 % NA SOLN: 2.0000 | Freq: Two times a day (BID) | NASAL | 0 refills | Status: AC

## 2016-06-14 MED ORDER — CETIRIZINE-PSEUDOEPHEDRINE ER 5-120 MG PO TB12: 1.0000 | ORAL_TABLET | Freq: Every day | ORAL | 0 refills | Status: DC

## 2016-06-14 MED ORDER — OXYMETAZOLINE HCL 0.05 % NA SOLN: 1.0000 | Freq: Two times a day (BID) | NASAL | 0 refills | Status: DC

## 2016-06-14 NOTE — ED Triage Notes (Signed)
Pt reports frontal headache that started one week ago. Radiates down into her neck. No acute distress noted at triage.

## 2016-06-14 NOTE — ED Provider Notes (Signed)
[facility]-EMERGENCY DEPT Provider Note   CSN: [id number] Arrival date & time: 06/14/16  1400     History   [id number] Complaint Chief Complaint  Patient presents with  . Headache  . Neck Pain    HPI [name] is a 82 y.o. female.  HPI Patient presents with 2 weeks of intermittent frontal headaches. States the headache radiates down to the lower face. Admits to nasal congestion and recent diagnosis of bronchitis. Also started on Z-Pak yesterday by her primary physician. She denies any fever or chills. She has no vision changes. Chest no focal weakness or numbness. She has ongoing chronic neck pain. No recent fall or head trauma. Past Medical History:  Diagnosis Date  . Pacemaker   . Peripheral vascular disease (HCC)   . Stroke [facility](HCC)     There are no active problems to display for this patient.   History reviewed. No pertinent surgical history.  OB History    No data available       Home Medications    Prior to Admission medications   Medication Sig Start Date End Date Taking? Authorizing Provider  loratadine (CLARITIN) 10 MG tablet Take 1 tablet (10 mg total) by mouth daily. 06/14/16   Loren Raceravid Lue Sykora, MD  oxymetazoline (AFRIN NASAL SPRAY) 0.05 % nasal spray Place 2 sprays into both nostrils 2 (two) times daily. 06/14/16 06/17/16  Loren Raceravid Martin Belling, MD    Family History History reviewed. No pertinent family history.  Social History Social History  Substance Use Topics  . Smoking status: Never Smoker  . Smokeless tobacco: Not on file  . Alcohol use No     Allergies   Codeine   Review of Systems Review of Systems  Constitutional: Negative for chills, fatigue and fever.  HENT: Positive for congestion and sinus pressure. Negative for facial swelling, rhinorrhea and sore throat.   Eyes: Negative for visual disturbance.  Respiratory: Positive for cough. Negative for shortness of breath.   Cardiovascular: Positive for leg swelling. Negative for chest pain and  palpitations.  Gastrointestinal: Negative for abdominal pain, constipation, diarrhea, nausea and vomiting.  Musculoskeletal: Positive for myalgias and neck pain. Negative for back pain and neck stiffness.  Skin: Negative for rash and wound.  Neurological: Positive for headaches. Negative for dizziness, syncope, weakness, light-headedness and numbness.  All other systems reviewed and are negative.    Physical Exam Updated Vital Signs BP (!) 106/50   Pulse 66   Temp 98.1 F (36.7 C) (Oral)   Resp 20   Wt 190 lb (86.2 kg)   SpO2 96%   Physical Exam  Constitutional: She is oriented to person, place, and time. She appears well-developed and well-nourished. No distress.  HENT:  Head: Normocephalic and atraumatic.  Mouth/Throat: Oropharynx is clear and moist.  Patient with tenderness to percussion of the right frontal sinus.  Eyes: EOM are normal. Pupils are equal, round, and reactive to light.  Neck: Normal range of motion. Neck supple.  Diffuse posterior neck tenderness. No definite midline tenderness. No meningismus.  Cardiovascular: Normal rate and regular rhythm.   Pulmonary/Chest: Effort normal and breath sounds normal. No respiratory distress. She has no wheezes. She has no rales. She exhibits no tenderness.  Abdominal: Soft. Bowel sounds are normal. There is no tenderness. There is no rebound and no guarding.  Musculoskeletal: Normal range of motion. She exhibits edema. She exhibits no tenderness.  3+ pitting edema bilaterally. Patient with bilateral lower extremity chronic venous insufficiency ulcerations. Mild erythema without warmth.  Lymphadenopathy:    She has no cervical adenopathy.  Neurological: She is alert and oriented to person, place, and time.  5/5 motor strength. Sensation is fully intact. Cranial nerves II through XII grossly intact.  Skin: Skin is warm and dry. Capillary refill takes less than 2 seconds. No rash noted. No erythema.  Psychiatric: She has a normal  mood and affect. Her behavior is normal.  Nursing note and vitals reviewed.    ED Treatments / Results  Labs (all labs ordered are listed, but only abnormal results are displayed) Labs Reviewed - No data to display  EKG  EKG Interpretation None       Radiology Ct Head Wo Contrast  Result Date: 06/14/2016 CLINICAL DATA:  Pt has had a headache for 2 weeks ; Pain is mainly across the front of her head She had a blood clot removed from her brain about 10 years ago EXAM: CT HEAD WITHOUT CONTRAST TECHNIQUE: Contiguous axial images were obtained from the base of the skull through the vertex without intravenous contrast. COMPARISON:  06/06/2015 FINDINGS: Brain: Significant central and cortical atrophy. Periventricular white matter changes are consistent with small vessel disease. There is no intra or extra-axial fluid collection or mass lesion. The basilar cisterns and ventricles have a normal appearance. There is no CT evidence for acute infarction or hemorrhage. Vascular: Dense atherosclerotic calcification of the carotid arteries. Skull: Status post left frontal craniotomy. Sinuses/Orbits: No acute finding. Other: None IMPRESSION: 1. Atrophy and small vessel disease. 2.  No evidence for acute  abnormality. 3. Status post left frontal craniotomy. Electronically Signed   By: Norva Pavlov M.D.   On: 06/14/2016 16:47    Procedures Procedures (including critical care time)  Medications Ordered in ED Medications - No data to display   Initial Impression / Assessment and Plan / ED Course  I have reviewed the triage vital signs and the nursing notes.  Pertinent labs & imaging results that were available during my care of the patient were reviewed by me and considered in my medical decision making (see chart for details).  Clinical Course    Patient is followed by wound care for her lower extremity wounds. She has a normal neurologic exam. Likely URI/sinusitis responsible for her  headache. Patient does have a history of intracranial bleed. We'll check a head CT but anticipate discharge home. Patient stands and follow-up closely with her primary physician.  Final Clinical Impressions(s) / ED Diagnoses   Final diagnoses:  Nonintractable episodic headache, unspecified headache type  Acute URI   CT without acute intracranial findings. Mild paranasal sinus disease. Return precautions given. New Prescriptions New Prescriptions   LORATADINE (CLARITIN) 10 MG TABLET    Take 1 tablet (10 mg total) by mouth daily.   OXYMETAZOLINE (AFRIN NASAL SPRAY) 0.05 % NASAL SPRAY    Place 2 sprays into both nostrils 2 (two) times daily.     Loren Racer, MD 06/14/16 [phone]

## 2018-07-20 IMAGING — CT CT HEAD W/O CM
3 of 4 series · 17 of 47 positions shown, 20 images · non-contrast
Comparison: 06/06/2015

CLINICAL DATA: Pt has had a headache for 2 weeks ; Pain is mainly
across the front of her head She had a blood clot removed from her
brain about 10 years ago

EXAM:
CT HEAD WITHOUT CONTRAST
TECHNIQUE: Contiguous axial images were obtained from the base of the skull
through the vertex without intravenous contrast.

[Series 201: head w/o, idose (1) · axial · non-contrast · 0.43mm/px · z∈[+390,+520]mm · 11 of 32 slices shown, 14 images]
[im 3/32  brain]
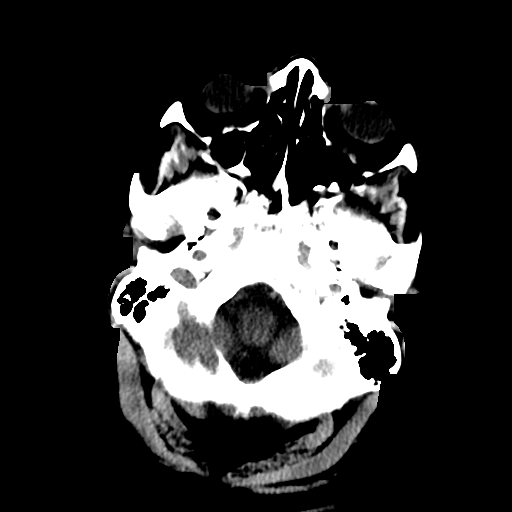
[im 3/32  bone]
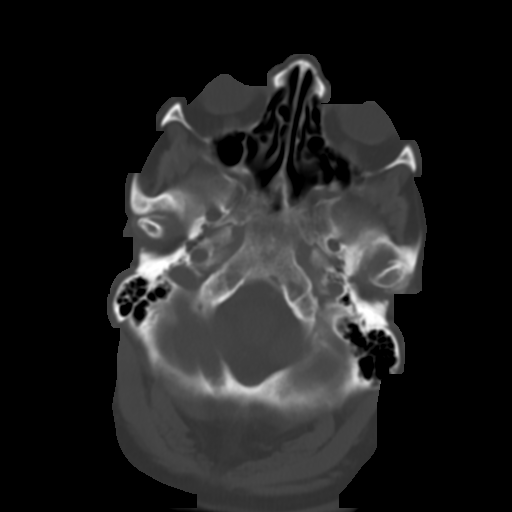
[im 5/32  brain]
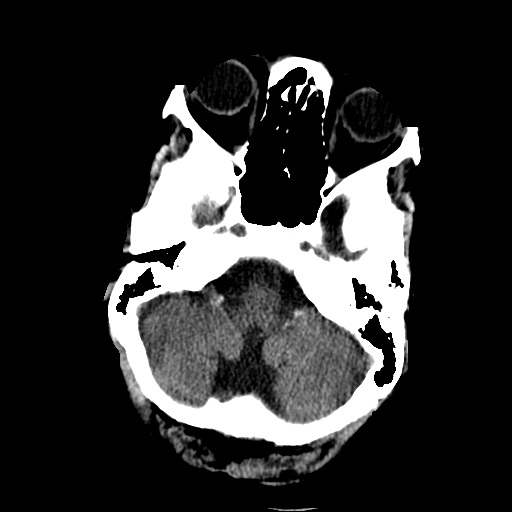
[im 7/32  brain]
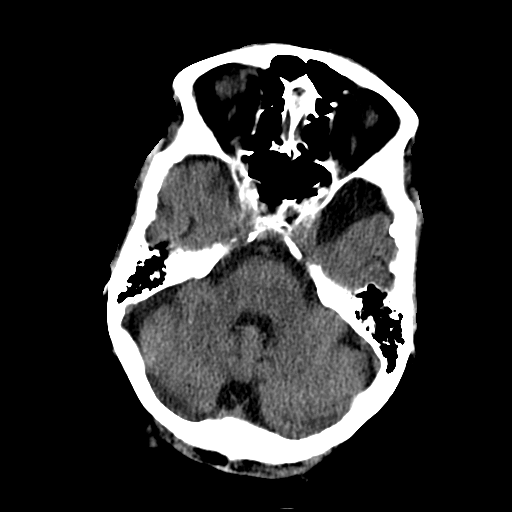
[im 12/32  brain]
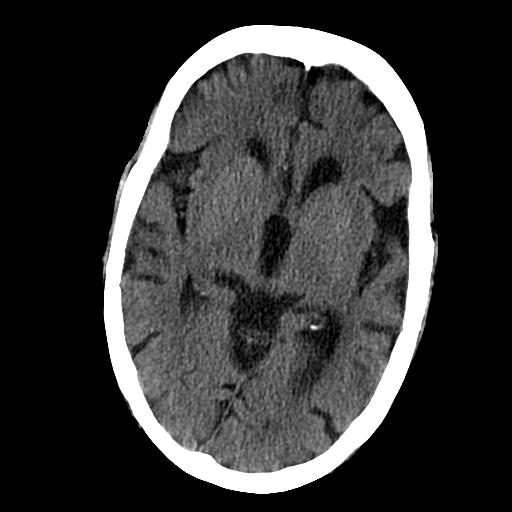
[im 14/32  brain]
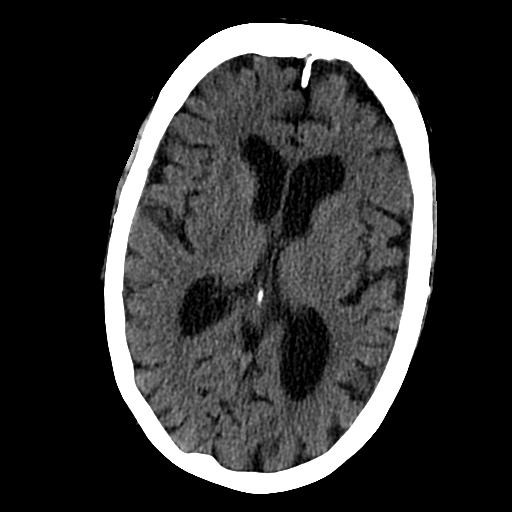
[im 14/32  bone]
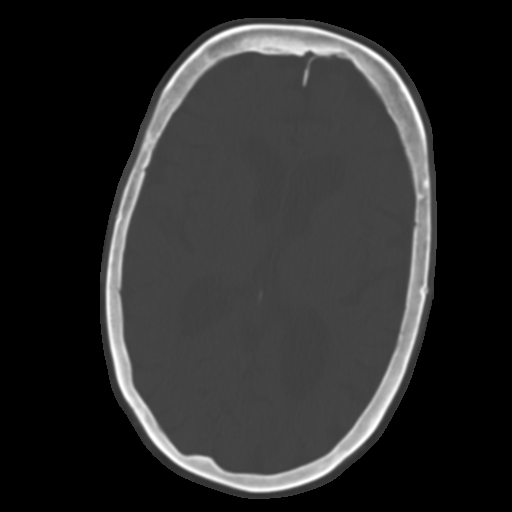
[im 16/32  brain]
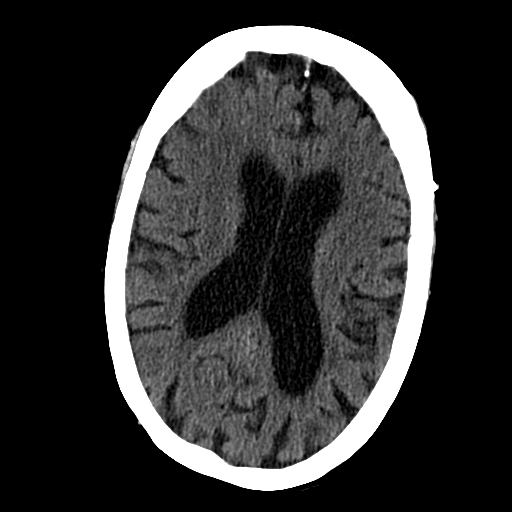
[im 18/32  brain]
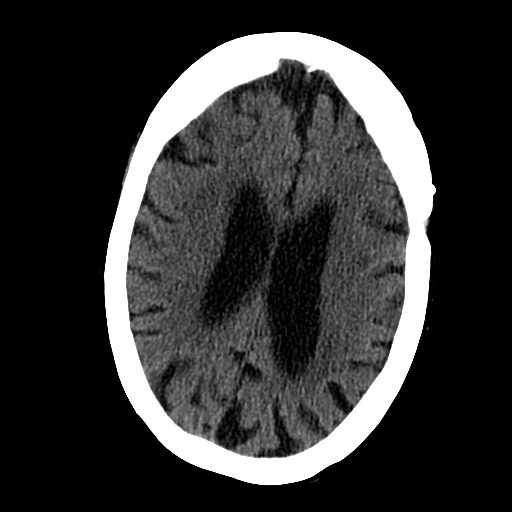
[im 20/32  brain]
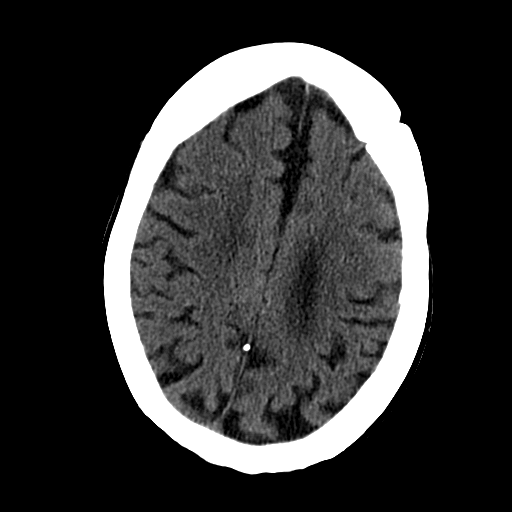
[im 25/32  brain]
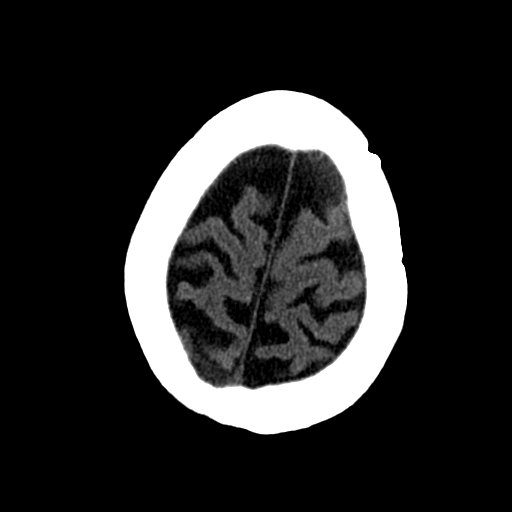
[im 25/32  bone]
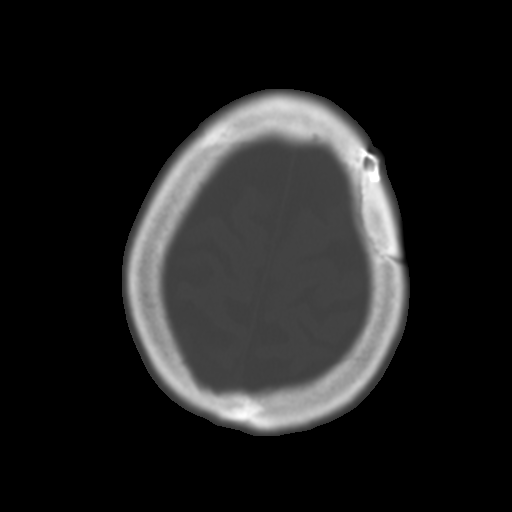
[im 27/32  brain]
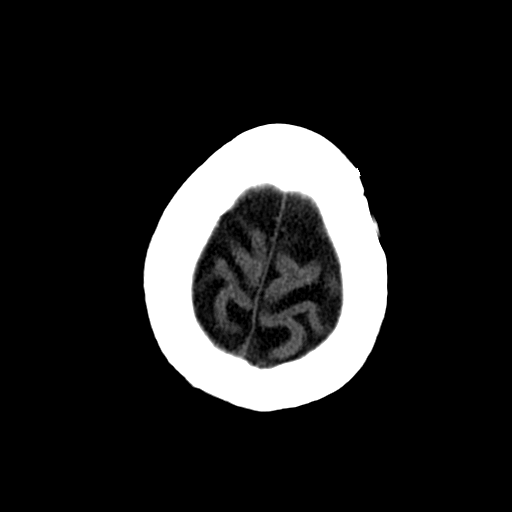
[im 29/32  brain]
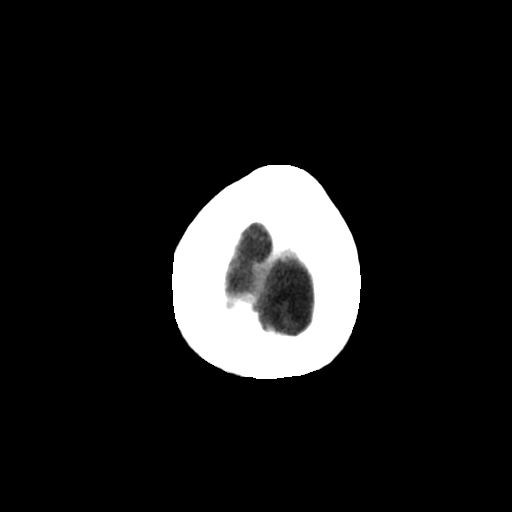

[Series 203: coronal st, idose (1) · coronal · 0.40mm/px · 3 of 71 slices shown]
[im 24/71  brain]
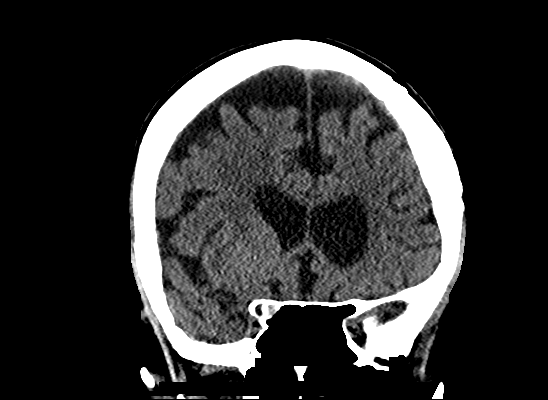
[im 32/71  brain]
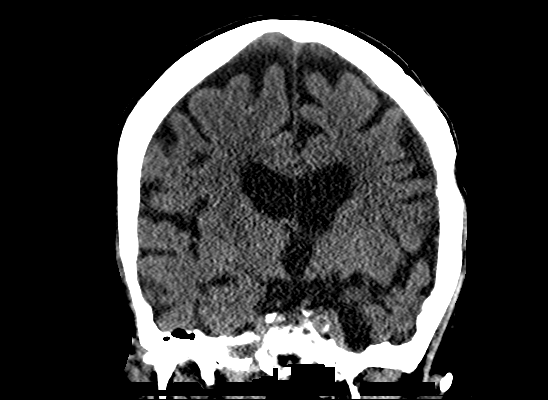
[im 39/71  brain]
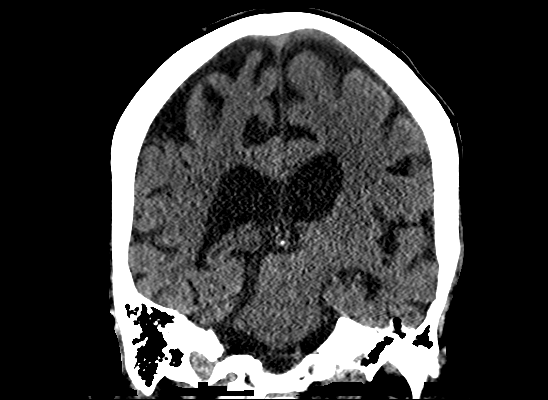

[Series 204: sagittal st, idose (1) · sagittal · 0.40mm/px · 3 of 73 slices shown]
[im 25/73  brain]
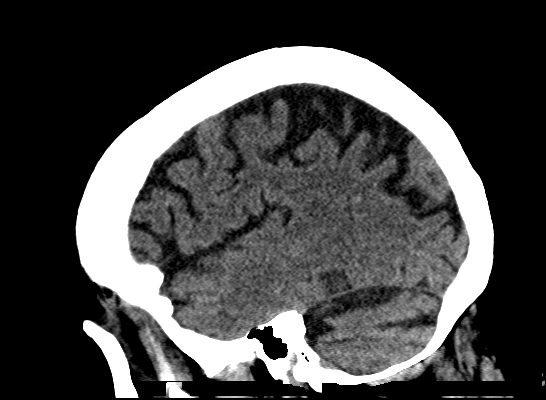
[im 37/73  brain]
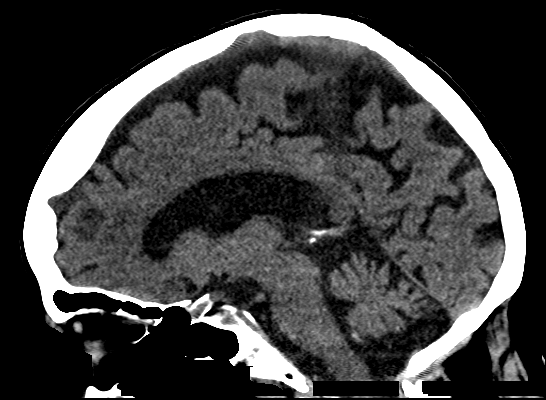
[im 49/73  brain]
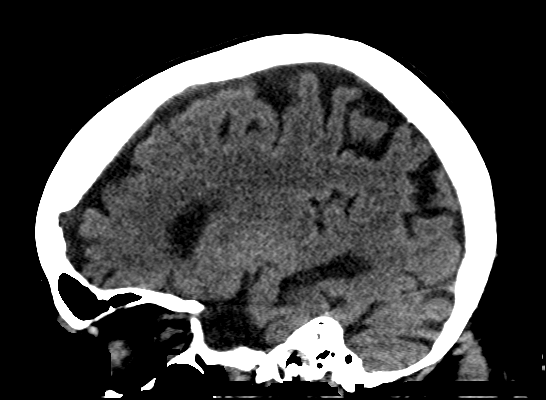

[17 of 47 positions shown; findings below may reference images not displayed]

FINDINGS: Brain: Significant central and cortical atrophy. Periventricular
white matter changes are consistent with small vessel disease. There
is no intra or extra-axial fluid collection or mass lesion. The
basilar cisterns and ventricles have a normal appearance. There is
no CT evidence for acute infarction or hemorrhage.

Vascular: Dense atherosclerotic calcification of the carotid
arteries.

Skull: Status post left frontal craniotomy.

Sinuses/Orbits: No acute finding.

Other: None
IMPRESSION: 1. Atrophy and small vessel disease.
2.  No evidence for acute  abnormality.
3. Status post left frontal craniotomy.

## 2022-10-24 DEATH — deceased
